# Patient Record
Sex: Female | Born: 1960 | Hispanic: No | Marital: Married | Smoking: Never smoker
Health system: Southern US, Community
[De-identification: ages and names within clinical notes are randomized; demographics above are authoritative.]

## PROBLEM LIST (undated history)

## (undated) DIAGNOSIS — E079 Disorder of thyroid, unspecified: Secondary | ICD-10-CM

## (undated) DIAGNOSIS — E119 Type 2 diabetes mellitus without complications: Secondary | ICD-10-CM

## (undated) DIAGNOSIS — I10 Essential (primary) hypertension: Secondary | ICD-10-CM

## (undated) HISTORY — PX: CHOLECYSTECTOMY: SHX55

## (undated) HISTORY — PX: OTHER SURGICAL HISTORY: SHX169

---

## 1998-01-16 ENCOUNTER — Other Ambulatory Visit: Admission: RE | Admit: 1998-01-16 | Discharge: 1998-01-16 | Payer: Self-pay | Admitting: Obstetrics and Gynecology

## 2000-12-23 ENCOUNTER — Other Ambulatory Visit: Admission: RE | Admit: 2000-12-23 | Discharge: 2000-12-23 | Payer: Self-pay | Admitting: Obstetrics and Gynecology

## 2002-02-09 ENCOUNTER — Other Ambulatory Visit: Admission: RE | Admit: 2002-02-09 | Discharge: 2002-02-09 | Payer: Self-pay | Admitting: Obstetrics and Gynecology

## 2016-09-12 ENCOUNTER — Observation Stay (HOSPITAL_COMMUNITY)
Admission: EM | Admit: 2016-09-12 | Discharge: 2016-09-13 | Disposition: A | Discharge status: Home / Self Care | Payer: Managed Care, Other (non HMO) | Attending: Internal Medicine | Admitting: Internal Medicine

## 2016-09-12 ENCOUNTER — Emergency Department (HOSPITAL_COMMUNITY): Payer: Managed Care, Other (non HMO)

## 2016-09-12 ENCOUNTER — Encounter (HOSPITAL_COMMUNITY): Payer: Self-pay | Admitting: Emergency Medicine

## 2016-09-12 DIAGNOSIS — Z881 Allergy status to other antibiotic agents status: Secondary | ICD-10-CM | POA: Insufficient documentation

## 2016-09-12 DIAGNOSIS — E039 Hypothyroidism, unspecified: Secondary | ICD-10-CM | POA: Insufficient documentation

## 2016-09-12 DIAGNOSIS — R0789 Other chest pain: Principal | ICD-10-CM | POA: Insufficient documentation

## 2016-09-12 DIAGNOSIS — R112 Nausea with vomiting, unspecified: Secondary | ICD-10-CM | POA: Insufficient documentation

## 2016-09-12 DIAGNOSIS — R011 Cardiac murmur, unspecified: Secondary | ICD-10-CM | POA: Diagnosis not present

## 2016-09-12 DIAGNOSIS — I10 Essential (primary) hypertension: Secondary | ICD-10-CM | POA: Diagnosis not present

## 2016-09-12 DIAGNOSIS — Z66 Do not resuscitate: Secondary | ICD-10-CM | POA: Insufficient documentation

## 2016-09-12 DIAGNOSIS — E119 Type 2 diabetes mellitus without complications: Secondary | ICD-10-CM | POA: Insufficient documentation

## 2016-09-12 DIAGNOSIS — Z8249 Family history of ischemic heart disease and other diseases of the circulatory system: Secondary | ICD-10-CM | POA: Insufficient documentation

## 2016-09-12 DIAGNOSIS — Z882 Allergy status to sulfonamides status: Secondary | ICD-10-CM | POA: Insufficient documentation

## 2016-09-12 DIAGNOSIS — R079 Chest pain, unspecified: Secondary | ICD-10-CM

## 2016-09-12 DIAGNOSIS — E118 Type 2 diabetes mellitus with unspecified complications: Secondary | ICD-10-CM

## 2016-09-12 DIAGNOSIS — E876 Hypokalemia: Secondary | ICD-10-CM | POA: Diagnosis not present

## 2016-09-12 HISTORY — DX: Type 2 diabetes mellitus without complications: E11.9

## 2016-09-12 HISTORY — DX: Essential (primary) hypertension: I10

## 2016-09-12 HISTORY — DX: Disorder of thyroid, unspecified: E07.9

## 2016-09-12 LAB — COMPREHENSIVE METABOLIC PANEL
ALT: 38 U/L (ref 14–54)
AST: 50 U/L — AB (ref 15–41)
Albumin: 3.8 g/dL (ref 3.5–5.0)
Alkaline Phosphatase: 59 U/L (ref 38–126)
Anion gap: 11 (ref 5–15)
BUN: 13 mg/dL (ref 6–20)
CHLORIDE: 103 mmol/L (ref 101–111)
CO2: 22 mmol/L (ref 22–32)
CREATININE: 0.83 mg/dL (ref 0.44–1.00)
Calcium: 9 mg/dL (ref 8.9–10.3)
GFR calc Af Amer: >60 mL/min (ref >60)
GLUCOSE: 136 mg/dL — AB (ref 65–99)
Potassium: 4.1 mmol/L (ref 3.5–5.1)
Sodium: 136 mmol/L (ref 135–145)
Total Bilirubin: 1.6 mg/dL — ABNORMAL HIGH (ref 0.3–1.2)
Total Protein: 6.4 g/dL — ABNORMAL LOW (ref 6.5–8.1)

## 2016-09-12 LAB — CBC
HEMATOCRIT: 43.2 % (ref 36.0–46.0)
Hemoglobin: 14.7 g/dL (ref 12.0–15.0)
MCH: 28 pg (ref 26.0–34.0)
MCHC: 34 g/dL (ref 30.0–36.0)
MCV: 82.3 fL (ref 78.0–100.0)
PLATELETS: 197 10*3/uL (ref 150–400)
RBC: 5.25 MIL/uL — AB (ref 3.87–5.11)
RDW: 13.6 % (ref 11.5–15.5)
WBC: 7.5 10*3/uL (ref 4.0–10.5)

## 2016-09-12 LAB — GLUCOSE, CAPILLARY
GLUCOSE-CAPILLARY: 123 mg/dL — AB (ref 65–99)
Glucose-Capillary: 163 mg/dL — ABNORMAL HIGH (ref 65–99)

## 2016-09-12 LAB — TROPONIN I

## 2016-09-12 MED ORDER — SODIUM CHLORIDE 0.9% FLUSH: 3.0000 mL | Freq: Two times a day (BID) | INTRAVENOUS | Status: DC

## 2016-09-12 MED ORDER — ACETAMINOPHEN 325 MG PO TABS: 650.0000 mg | ORAL_TABLET | ORAL | Status: DC | PRN

## 2016-09-12 MED ORDER — GI COCKTAIL ~~LOC~~: 30.0000 mL | Freq: Four times a day (QID) | ORAL | Status: DC | PRN

## 2016-09-12 MED ORDER — LEVOTHYROXINE SODIUM 100 MCG PO TABS
100.0000 ug | ORAL_TABLET | Freq: Every day | ORAL | Status: DC
  Administered 2016-09-13: 100 ug via ORAL
  Filled 2016-09-12: qty 1

## 2016-09-12 MED ORDER — SODIUM CHLORIDE 0.9% FLUSH
3.0000 mL | Freq: Two times a day (BID) | INTRAVENOUS | Status: DC
  Administered 2016-09-13: 3 mL via INTRAVENOUS

## 2016-09-12 MED ORDER — INSULIN ASPART 100 UNIT/ML ~~LOC~~ SOLN
0.0000 [IU] | Freq: Three times a day (TID) | SUBCUTANEOUS | Status: DC
  Administered 2016-09-13: 2 [IU] via SUBCUTANEOUS

## 2016-09-12 MED ORDER — SODIUM CHLORIDE 0.9% FLUSH: 3.0000 mL | INTRAVENOUS | Status: DC | PRN

## 2016-09-12 MED ORDER — ENOXAPARIN SODIUM 40 MG/0.4ML ~~LOC~~ SOLN
40.0000 mg | SUBCUTANEOUS | Status: DC
  Filled 2016-09-12: qty 0.4

## 2016-09-12 MED ORDER — HYDROCHLOROTHIAZIDE 25 MG PO TABS
25.0000 mg | ORAL_TABLET | Freq: Every day | ORAL | Status: DC
  Administered 2016-09-13: 25 mg via ORAL
  Filled 2016-09-12: qty 1

## 2016-09-12 MED ORDER — ACETAMINOPHEN 325 MG PO TABS: 650.0000 mg | ORAL_TABLET | Freq: Four times a day (QID) | ORAL | Status: DC | PRN

## 2016-09-12 MED ORDER — ASPIRIN 325 MG PO TABS: 325.0000 mg | ORAL_TABLET | Freq: Once | ORAL | Status: DC

## 2016-09-12 MED ORDER — NITROGLYCERIN 0.4 MG SL SUBL: 0.4000 mg | SUBLINGUAL_TABLET | SUBLINGUAL | Status: DC | PRN

## 2016-09-12 MED ORDER — INSULIN ASPART 100 UNIT/ML ~~LOC~~ SOLN: 0.0000 [IU] | Freq: Every day | SUBCUTANEOUS | Status: DC

## 2016-09-12 MED ORDER — POTASSIUM CHLORIDE CRYS ER 20 MEQ PO TBCR
20.0000 meq | EXTENDED_RELEASE_TABLET | Freq: Every day | ORAL | Status: DC
  Administered 2016-09-13: 20 meq via ORAL
  Filled 2016-09-12: qty 1

## 2016-09-12 MED ORDER — ALPRAZOLAM 0.25 MG PO TABS: 0.2500 mg | ORAL_TABLET | Freq: Two times a day (BID) | ORAL | Status: DC | PRN

## 2016-09-12 MED ORDER — HEPARIN SODIUM (PORCINE) 5000 UNIT/ML IJ SOLN: 5000.0000 [IU] | Freq: Three times a day (TID) | INTRAMUSCULAR | Status: DC

## 2016-09-12 MED ORDER — ONDANSETRON HCL 4 MG/2ML IJ SOLN: 4.0000 mg | Freq: Four times a day (QID) | INTRAMUSCULAR | Status: DC | PRN

## 2016-09-12 MED ORDER — SODIUM CHLORIDE 0.9 % IV SOLN: 250.0000 mL | INTRAVENOUS | Status: DC | PRN

## 2016-09-12 MED ORDER — CARVEDILOL 12.5 MG PO TABS
6.2500 mg | ORAL_TABLET | Freq: Two times a day (BID) | ORAL | Status: DC
  Administered 2016-09-12 – 2016-09-13 (×2): 6.25 mg via ORAL
  Filled 2016-09-12 (×2): qty 1

## 2016-09-12 MED ORDER — ASPIRIN 81 MG PO CHEW: 324.0000 mg | CHEWABLE_TABLET | Freq: Once | ORAL | Status: DC

## 2016-09-12 MED ORDER — ONDANSETRON HCL 4 MG PO TABS: 4.0000 mg | ORAL_TABLET | Freq: Four times a day (QID) | ORAL | Status: DC | PRN

## 2016-09-12 MED ORDER — OXYCODONE HCL 5 MG PO TABS: 5.0000 mg | ORAL_TABLET | ORAL | Status: DC | PRN

## 2016-09-12 MED ORDER — ENOXAPARIN SODIUM 40 MG/0.4ML ~~LOC~~ SOLN: 40.0000 mg | SUBCUTANEOUS | Status: DC

## 2016-09-12 MED ORDER — ACETAMINOPHEN 650 MG RE SUPP: 650.0000 mg | Freq: Four times a day (QID) | RECTAL | Status: DC | PRN

## 2016-09-12 NOTE — ED Triage Notes (Signed)
PT reports sudden onset central chest pain started at 1030 am. PT reports burning pain and pressure over central chest. PT felt as though pain would relieve if she belched. PT began to vomit. PT had 324 ASA in route, 4mg  IV zofran, 3 SL nitro tablets. PT reports chest pain has completely resolved. PT states, "I think it resolved after the last time I vomited." PT is unsure how many nitro she had beofre pain resolved.   PT reports no cardiac history or abdominal history.

## 2016-09-12 NOTE — H&P (Signed)
History and Physical    Candace ItoDeborah W Walls KGM:[id number] DOB: 1961/04/21 DOA: 09/12/2016  PCP: No primary care provider on file. Patient coming from: home  Chief Complaint: CP  HPI: Candace ItoDeborah W Walls is a 55 y.o. Walls with medical history significant of the chest abruptly at 10:30. Substernal. Pressure-like. Came on while cooking in the kitchen. Associated with flushed feeling and nausea. Tums without benefit. Nausea and phlegm-like emesis 2. Lasted 15-20 minutes. Relieved after receiving nitroglycerin. Approximately 4 weeks ago patient stopped atenolol and was started on carvedilol. No other recent medication changes. No previous cardiac workup.  Not associated with worsening from deep respirations or certain body movements. Rested after onset so no exertional assessment.  ED Course: Aspirin and nitroglycerin given. EKG outlined below.  Review of Systems: As per HPI otherwise 10 point review of systems negative.   Ambulatory Status: no restrictions  Past Medical History:  Diagnosis Date  . Diabetes mellitus without complication (HCC)   . Hypertension   . Thyroid disease     Past Surgical History:  Procedure Laterality Date  . DNC      Social History   Social History  . Marital status: Married    Spouse name: N/A  . Number of children: N/A  . Years of education: N/A   Occupational History  . Not on file.   Social History Main Topics  . Smoking status: Never Smoker  . Smokeless tobacco: Never Used  . Alcohol use No  . Drug use: No  . Sexual activity: Not on file   Other Topics Concern  . Not on file   Social History Narrative  . No narrative on file    Allergies  Allergen Reactions  . Sulfa Antibiotics Hives  . Minocycline Hives, Swelling and Rash    Family History  Problem Relation Age of Onset  . Heart disease Father   . Diabetes Father   . Renal Disease Father     ESRD  . Heart disease Sister   . Stroke Maternal Grandfather     Prior to  Admission medications   Medication Sig Start Date End Date Taking? Authorizing Provider  carvedilol (COREG) 6.25 MG tablet Take 6.25 mg by mouth 2 (two) times daily with a meal.   Yes Historical Provider, MD  dapagliflozin propanediol (FARXIGA) 10 MG TABS tablet Take 10 mg by mouth daily.   Yes Historical Provider, MD  hydrochlorothiazide (HYDRODIURIL) 25 MG tablet Take 25 mg by mouth daily.   Yes Historical Provider, MD  ibuprofen (ADVIL) 200 MG tablet Take 200 mg by mouth every 6 (six) hours as needed for mild pain.   Yes Historical Provider, MD  levothyroxine (SYNTHROID, LEVOTHROID) 100 MCG tablet Take 100 mcg by mouth daily before breakfast.   Yes Historical Provider, MD  potassium chloride SA (K-DUR,KLOR-CON) 20 MEQ tablet Take 20 mEq by mouth daily.   Yes Historical Provider, MD    Physical Exam: Vitals:   09/12/16 1415 09/12/16 1453 09/12/16 1454 09/12/16 1455  BP:  136/67    Pulse: 76 82 71 63  Resp: 18  26 19   Temp:      TempSrc:      SpO2: 98% 96% 97% 97%  Weight:      Height:         General:  Appears calm and comfortable Eyes:  PERRL, EOMI, normal lids, iris ENT:  grossly normal hearing, lips & tongue, mmm Neck:  no LAD, masses or thyromegaly Cardiovascular:  RRR, II/VI systolic murmur .  No LE edema.  Respiratory:  CTA bilaterally, no w/r/r. Normal respiratory effort. Abdomen:  soft, ntnd, NABS Skin:  no rash or induration seen on limited exam Musculoskeletal:  grossly normal tone BUE/BLE, good ROM, no bony abnormality Psychiatric:  grossly normal mood and affect, speech fluent and appropriate, AOx3 Neurologic:  CN 2-12 grossly intact, moves all extremities in coordinated fashion, sensation intact  Labs on Admission: I have personally reviewed following labs and imaging studies  CBC:  Recent Labs Lab 09/12/16 1300  WBC 7.5  HGB 14.7  HCT 43.2  MCV 82.3  PLT 197   Basic Metabolic Panel:  Recent Labs Lab 09/12/16 1300  NA 136  K 4.1  CL 103  CO2 22    GLUCOSE 136*  BUN 13  CREATININE 0.83  CALCIUM 9.0   GFR: Estimated Creatinine Clearance: 73.8 mL/min (by C-G formula based on SCr of 0.83 mg/dL). Liver Function Tests:  Recent Labs Lab 09/12/16 1300  AST 50*  ALT 38  ALKPHOS 59  BILITOT 1.6*  PROT 6.4*  ALBUMIN 3.8   No results for input(s): LIPASE, AMYLASE in the last 168 hours. No results for input(s): AMMONIA in the last 168 hours. Coagulation Profile: No results for input(s): INR, PROTIME in the last 168 hours. Cardiac Enzymes:  Recent Labs Lab 09/12/16 1300  TROPONINI <0.03   BNP (last 3 results) No results for input(s): PROBNP in the last 8760 hours. HbA1C: No results for input(s): HGBA1C in the last 72 hours. CBG: No results for input(s): GLUCAP in the last 168 hours. Lipid Profile: No results for input(s): CHOL, HDL, LDLCALC, TRIG, CHOLHDL, LDLDIRECT in the last 72 hours. Thyroid Function Tests: No results for input(s): TSH, T4TOTAL, FREET4, T3FREE, THYROIDAB in the last 72 hours. Anemia Panel: No results for input(s): VITAMINB12, FOLATE, FERRITIN, TIBC, IRON, RETICCTPCT in the last 72 hours. Urine analysis: No results found for: COLORURINE, APPEARANCEUR, LABSPEC, PHURINE, GLUCOSEU, HGBUR, BILIRUBINUR, KETONESUR, PROTEINUR, UROBILINOGEN, NITRITE, LEUKOCYTESUR  Creatinine Clearance: Estimated Creatinine Clearance: 73.8 mL/min (by C-G formula based on SCr of 0.83 mg/dL).  Sepsis Labs: @LABRCNTIP (procalcitonin:4,lacticidven:4) )No results found for this or any previous visit (from the past 240 hour(s)).   Radiological Exams on Admission: Dg Chest 2 View  Result Date: 09/12/2016 CLINICAL DATA:  Mid chest pain for 2-3 weeks. EXAM: CHEST  2 VIEW COMPARISON:  None. FINDINGS: The heart, hila, mediastinum, lungs, and pleura are normal. IMPRESSION: No active cardiopulmonary disease. Electronically Signed   By: Gerome Samavid  Williams III M.D   On: 09/12/2016 14:48    EKG: Independently reviewed. Sinus. Nonspecific  changes  Assessment/Plan Active Problems:   Chest pain   Essential hypertension   Hypothyroidism   Diabetes mellitus with complication (HCC)   CP: HEART score 5. No previous cardiac workup. Trop negative. EKG w/o over signs of ACS. Relieved w/ Nitro by EMS.  - Tele - cycle trop - EKG in am - GI cocktail - ASA, Nitro - NM stress test.   HTN: - Coreg, HCTZ  Hypothyroidism: - TSH  DM:  - SSI   DVT prophylaxis: lovenox  Code Status: full  Family Communication: none  Disposition Plan: pending cp r/o and NM stress test  Consults called: none  Admission status: obs - tele    Rosemarie Dondero J MD [facility]  If 7PM-7AM, please contact night-coverage [email or web address] Password [email or web address]  09/12/2016, 3:57 PM

## 2016-09-12 NOTE — ED Provider Notes (Signed)
[facility]-EMERGENCY DEPT Provider Note   CSN: [id number] Arrival date & time: 09/12/16  1228     History   Chief Complaint Chief Complaint  Patient presents with  . Chest Pain    HPI Karl ItoDeborah W Clary is a 55 y.o. female.   Chest Pain   This is a new problem. The current episode started 1 to 2 hours ago. The problem occurs constantly. The problem has been resolved. The pain is associated with exertion. The pain is present in the substernal region. The pain is moderate. The quality of the pain is described as pressure-like. The pain radiates to the left shoulder. Duration of episode(s) is 45 minutes. Associated symptoms include nausea, shortness of breath and vomiting.    Past Medical History:  Diagnosis Date  . Diabetes mellitus without complication (HCC)   . Hypertension   . Thyroid disease     There are no active problems to display for this patient.   Past Surgical History:  Procedure Laterality Date  . DNC      OB History    No data available       Home Medications    Prior to Admission medications   Medication Sig Start Date End Date Taking? Authorizing Provider  carvedilol (COREG) 6.25 MG tablet Take 6.25 mg by mouth 2 (two) times daily with a meal.   Yes Historical Provider, MD  dapagliflozin propanediol (FARXIGA) 10 MG TABS tablet Take 10 mg by mouth daily.   Yes Historical Provider, MD  hydrochlorothiazide (HYDRODIURIL) 25 MG tablet Take 25 mg by mouth daily.   Yes Historical Provider, MD  ibuprofen (ADVIL) 200 MG tablet Take 200 mg by mouth every 6 (six) hours as needed for mild pain.   Yes Historical Provider, MD  levothyroxine (SYNTHROID, LEVOTHROID) 100 MCG tablet Take 100 mcg by mouth daily before breakfast.   Yes Historical Provider, MD  potassium chloride SA (K-DUR,KLOR-CON) 20 MEQ tablet Take 20 mEq by mouth daily.   Yes Historical Provider, MD    Family History No family history on file.  Social History Social History  Substance Use Topics    . Smoking status: Never Smoker  . Smokeless tobacco: Never Used  . Alcohol use No     Allergies   Sulfa antibiotics and Minocycline   Review of Systems Review of Systems  Respiratory: Positive for shortness of breath.   Cardiovascular: Positive for chest pain.  Gastrointestinal: Positive for nausea and vomiting.  All other systems reviewed and are negative.    Physical Exam Updated Vital Signs BP 140/55   Pulse 63   Temp 98.1 F (36.7 C) (Oral)   Resp 20   Ht 5' (1.524 m)   Wt 178 lb (80.7 kg)   SpO2 100%   BMI 34.76 kg/m   Physical Exam  Constitutional: She is oriented to person, place, and time. She appears well-developed and well-nourished.  HENT:  Head: Normocephalic and atraumatic.  Eyes: Conjunctivae and EOM are normal.  Neck: Normal range of motion.  Cardiovascular: Normal rate and regular rhythm.  Exam reveals no gallop and no friction rub.   No murmur heard. Pulmonary/Chest: Effort normal. No stridor. No respiratory distress.  Abdominal: Soft. She exhibits no distension.  Musculoskeletal: She exhibits no edema or deformity.  Neurological: She is alert and oriented to person, place, and time.  Skin: Skin is warm and dry.  Nursing note and vitals reviewed.    ED Treatments / Results  Labs (all labs ordered are listed, but  only abnormal results are displayed) Labs Reviewed  CBC  COMPREHENSIVE METABOLIC PANEL  TROPONIN I  I-STAT TROPOININ, ED    EKG  EKG Interpretation  Date/Time:  Thursday September 12 2016 12:33:05 EST Ventricular Rate:  66 PR Interval:    QRS Duration: 100 QT Interval:  427 QTC Calculation: 448 R Axis:   20 Text Interpretation:  Sinus rhythm Probable left atrial enlargement RSR' in V1 or V2, probably normal variant Borderline repolarization abnormality nonspecific ST changes in inferor and atnerior leads No old tracing to compare Confirmed by [facility] MD, Barbara CowerJASON [phone]) on 09/12/2016 12:44:54 PM       Radiology No  results found.  Procedures Procedures (including critical care time)  Medications Ordered in ED Medications  aspirin chewable tablet 324 mg (not administered)  nitroGLYCERIN (NITROSTAT) SL tablet 0.4 mg (not administered)     Initial Impression / Assessment and Plan / ED Course  I have reviewed the triage vital signs and the nursing notes.  Pertinent labs & imaging results that were available during my care of the patient were reviewed by me and considered in my medical decision making (see chart for details).  Clinical Course     Atypical chest pain but some family history of ht dz at early age and some risk factors so will admit to hospital for management and further workup   Final Clinical Impressions(s) / ED Diagnoses   Final diagnoses:  None    New Prescriptions New Prescriptions   No medications on file     Marily MemosJason Karlee Staff, MD 09/14/16 [phone]

## 2016-09-13 ENCOUNTER — Encounter (HOSPITAL_COMMUNITY): Payer: Self-pay | Admitting: Student

## 2016-09-13 ENCOUNTER — Other Ambulatory Visit: Payer: Self-pay | Admitting: Student

## 2016-09-13 ENCOUNTER — Observation Stay (HOSPITAL_BASED_OUTPATIENT_CLINIC_OR_DEPARTMENT_OTHER): Payer: Managed Care, Other (non HMO)

## 2016-09-13 DIAGNOSIS — E118 Type 2 diabetes mellitus with unspecified complications: Secondary | ICD-10-CM | POA: Diagnosis not present

## 2016-09-13 DIAGNOSIS — R072 Precordial pain: Secondary | ICD-10-CM | POA: Diagnosis not present

## 2016-09-13 DIAGNOSIS — I200 Unstable angina: Secondary | ICD-10-CM

## 2016-09-13 DIAGNOSIS — I10 Essential (primary) hypertension: Secondary | ICD-10-CM | POA: Diagnosis not present

## 2016-09-13 DIAGNOSIS — R079 Chest pain, unspecified: Secondary | ICD-10-CM

## 2016-09-13 DIAGNOSIS — E038 Other specified hypothyroidism: Secondary | ICD-10-CM | POA: Diagnosis not present

## 2016-09-13 DIAGNOSIS — I38 Endocarditis, valve unspecified: Secondary | ICD-10-CM

## 2016-09-13 DIAGNOSIS — R011 Cardiac murmur, unspecified: Secondary | ICD-10-CM

## 2016-09-13 LAB — NM MYOCAR MULTI W/SPECT W/WALL MOTION / EF
CHL CUP NUCLEAR SDS: 3
CHL CUP NUCLEAR SRS: 5
CHL CUP NUCLEAR SSS: 8
Exercise duration (min): 4 min
LV dias vol: 65 mL (ref 46–106)
LVSYSVOL: 15 mL
NUC STRESS TID: 1.13
RATE: 0.13
Rest HR: 62 {beats}/min

## 2016-09-13 LAB — GLUCOSE, CAPILLARY
Glucose-Capillary: 155 mg/dL — ABNORMAL HIGH (ref 65–99)
Glucose-Capillary: 123 mg/dL — ABNORMAL HIGH (ref 65–99)
Glucose-Capillary: 140 mg/dL — ABNORMAL HIGH (ref 65–99)

## 2016-09-13 LAB — BASIC METABOLIC PANEL
ANION GAP: 10 (ref 5–15)
BUN: 12 mg/dL (ref 6–20)
CALCIUM: 8.8 mg/dL — AB (ref 8.9–10.3)
CO2: 25 mmol/L (ref 22–32)
CREATININE: 0.76 mg/dL (ref 0.44–1.00)
Chloride: 102 mmol/L (ref 101–111)
Glucose, Bld: 159 mg/dL — ABNORMAL HIGH (ref 65–99)
Potassium: 2.9 mmol/L — ABNORMAL LOW (ref 3.5–5.1)
SODIUM: 137 mmol/L (ref 135–145)

## 2016-09-13 LAB — CBC
HCT: 40.7 % (ref 36.0–46.0)
Hemoglobin: 13.8 g/dL (ref 12.0–15.0)
MCH: 27.9 pg (ref 26.0–34.0)
MCHC: 33.9 g/dL (ref 30.0–36.0)
MCV: 82.2 fL (ref 78.0–100.0)
PLATELETS: 235 10*3/uL (ref 150–400)
RBC: 4.95 MIL/uL (ref 3.87–5.11)
RDW: 13.5 % (ref 11.5–15.5)
WBC: 6.4 10*3/uL (ref 4.0–10.5)

## 2016-09-13 MED ORDER — TECHNETIUM TC 99M TETROFOSMIN IV KIT
10.0000 | PACK | Freq: Once | INTRAVENOUS | Status: AC | PRN
  Administered 2016-09-13: 10 via INTRAVENOUS

## 2016-09-13 MED ORDER — REGADENOSON 0.4 MG/5ML IV SOLN
INTRAVENOUS | Status: AC
  Filled 2016-09-13: qty 5

## 2016-09-13 MED ORDER — POTASSIUM CHLORIDE CRYS ER 20 MEQ PO TBCR
40.0000 meq | EXTENDED_RELEASE_TABLET | Freq: Once | ORAL | Status: AC
  Administered 2016-09-13: 40 meq via ORAL
  Filled 2016-09-13: qty 2

## 2016-09-13 MED ORDER — OMEPRAZOLE 40 MG PO CPDR: 40.0000 mg | DELAYED_RELEASE_CAPSULE | Freq: Every day | ORAL | 0 refills | Status: AC

## 2016-09-13 MED ORDER — REGADENOSON 0.4 MG/5ML IV SOLN
0.4000 mg | Freq: Once | INTRAVENOUS | Status: AC
  Administered 2016-09-13: 0.4 mg via INTRAVENOUS

## 2016-09-13 MED ORDER — TECHNETIUM TC 99M TETROFOSMIN IV KIT
30.0000 | PACK | Freq: Once | INTRAVENOUS | Status: AC | PRN
  Administered 2016-09-13: 30 via INTRAVENOUS

## 2016-09-13 NOTE — Consult Note (Signed)
Cardiology Consult    Patient ID: Candace ItoDeborah W Gindlesperger MRN: [id number], DOB/AGE: 53/16/1962   Admit date: 09/12/2016 Date of Consult: 09/13/2016  Primary Physician: No primary care provider on file. Reason for Consult: Chest Pain Primary Cardiologist: New - Dr. Antoine PocheHochrein Requesting Provider: Dr. Arbutus Leasat   History of Present Illness    Candace Walls is a 55 y.o. female with past medical history of HTN, Hypothyroidism, Type 2 DM, and no prior cardiac history who [facility] for evaluation of chest pain.   She reports developing chest discomfort yesterday morning while cooking for Thanksgiving. Consumed a piece of [place] then developed a sternal chest pressure minutes after. Denies any associated dyspnea, diaphoresis, or radiating pain. She took TUMS without relief. She then became anxious about her pain and told her husband to call EMS. She was given SL NTG while [facility] route [facility] which did not fully relieve her symptoms. This did cause her to experience nausea and vomiting. Her pain spontaneously resolved while in the ED and has not represented since.   She denies any recent chest discomfort or dyspnea with exertion. [place] and had mild dyspnea while walking on the trail, but she reports not exercising regularly and says she is not use to walking at that pace or incline. Has been able to perform activities at home and at work with no anginal symptoms.  She does have a family history of CAD, with her sister requiring "open-heart surgery" at age 53. Unsure if this was CABG or a different procedure. Her father passed away from an MI in his 1960's. She denies any prior tobacco use, alcohol use, or recreational drug use.   While admitted, labs show a WBC of 7.5, Hgb 14.7, and platelets 197. K+ 4.1. Creatinine 0.83. Cyclic troponin values have been negative. CXR with no acite cardiopulmonary disease. EKG showed NSR, HR 66  with TWI in leads III and V1. Repeat EKG this AM shows sinus bradycardia, HR 56, with mild LVH and TWI in leads III, V1, and V2. No prior tracings are available for comparison.     Past Medical History   Past Medical History:  Diagnosis Date  . Diabetes mellitus without complication (HCC)   . Hypertension   . Thyroid disease     Past Surgical History:  Procedure Laterality Date  . DNC       Allergies  Allergies  Allergen Reactions  . Sulfa Antibiotics Hives  . Minocycline Hives, Swelling and Rash    Inpatient Medications    . aspirin  324 mg Oral Once  . carvedilol  6.25 mg Oral BID WC  . enoxaparin (LOVENOX) injection  40 mg Subcutaneous Q24H  . hydrochlorothiazide  25 mg Oral Daily  . insulin aspart  0-5 Units Subcutaneous QHS  . insulin aspart  0-9 Units Subcutaneous TID WC  . levothyroxine  100 mcg Oral QAC breakfast  . potassium chloride SA  20 mEq Oral Daily  . sodium chloride flush  3 mL Intravenous Q12H  . sodium chloride flush  3 mL Intravenous Q12H    Family History    Family History  Problem Relation Age of Onset  . Heart disease Father     Fatal MI in his 4260's.   . Diabetes Father   . Renal Disease Father     ESRD  . Heart disease Sister   . Stroke Maternal Grandfather  Social History    Social History   Social History  . Marital status: Married    Spouse name: N/A  . Number of children: N/A  . Years of education: N/A   Occupational History  . Not on file.   Social History Main Topics  . Smoking status: Never Smoker  . Smokeless tobacco: Never Used  . Alcohol use No  . Drug use: No  . Sexual activity: Not on file   Other Topics Concern  . Not on file   Social History Narrative  . No narrative on file     Review of Systems    General:  No chills, fever, night sweats or weight changes.  Cardiovascular:  No dyspnea on exertion, edema, orthopnea, palpitations, paroxysmal nocturnal dyspnea. Positive for chest pain.    Dermatological: No rash, lesions/masses Respiratory: No cough, dyspnea Urologic: No hematuria, dysuria Abdominal:   No diarrhea, bright red blood per rectum, melena, or hematemesis. Positive for nausea and vomiting.  Neurologic:  No visual changes, wkns, changes in mental status. All other systems reviewed and are otherwise negative except as noted above.  Physical Exam    Blood pressure 140/70, pulse 68, temperature 98.4 F (36.9 C), temperature source Oral, resp. rate 18, height 5' (1.524 m), weight 174 lb 6.4 oz (79.1 kg), SpO2 100 %.  General: Pleasant, Caucasian female appearing in NAD. Psych: Normal affect. Neuro: Alert and oriented X 3. Moves all extremities spontaneously. HEENT: Normal  Neck: Supple without bruits or JVD. Lungs:  Resp regular and unlabored, CTA without wheezing or rales. Heart: RRR no s3, s4, or murmurs. Abdomen: Soft, non-tender, non-distended, BS + x 4.  Extremities: No clubbing, cyanosis or edema. DP/PT/Radials 2+ and equal bilaterally.  Labs    Troponin (Point of Care Test) No results for input(s): TROPIPOC in the last 72 hours.  Recent Labs  09/12/16 1300 09/12/16 1647 09/12/16 1815 09/12/16 2200  TROPONINI <0.03 <0.03 <0.03 <0.03   Lab Results  Component Value Date   WBC 6.4 09/13/2016   HGB 13.8 09/13/2016   HCT 40.7 09/13/2016   MCV 82.2 09/13/2016   PLT 235 09/13/2016     Recent Labs Lab 09/12/16 1300 09/13/16 0406  NA 136 137  K 4.1 2.9*  CL 103 102  CO2 22 25  BUN 13 12  CREATININE 0.83 0.76  CALCIUM 9.0 8.8*  PROT 6.4*  --   BILITOT 1.6*  --   ALKPHOS 59  --   ALT 38  --   AST 50*  --   GLUCOSE 136* 159*   No results found for: CHOL, HDL, LDLCALC, TRIG No results found for: [facility]   Radiology Studies    Dg Chest 2 View  Result Date: 09/12/2016 CLINICAL DATA:  Mid chest pain for 2-3 weeks. EXAM: CHEST  2 VIEW COMPARISON:  None. FINDINGS: The heart, hila, mediastinum, lungs, and pleura are normal. IMPRESSION: No  active cardiopulmonary disease. Electronically Signed   By: Gerome Sam III M.D   On: 09/12/2016 14:48    EKG & Cardiac Imaging    EKG: Sinus bradycardia, HR 56, with LVH and TWI in leads III, V1, and V2. (No previous tracings prior to this admission available for comparison).   Echocardiogram: None on File  Assessment & Plan    1. Atypical Chest Pain  - the patient developed chest discomfort while consuming [place] yesterday morning. No association with exertion. Pain was not relieved with TUMS or NTG, but resolved spontaneously while in the  ED. Denies any recurrence of chest discomfort since being admitted. - no known prior cardiac history. Does have cardiac risk factors including HTN, Type 2 DM, and family history of CAD (sister required "open-heart surgery" at age 53. Unsure if this was CABG or a different procedure. Father passed away from an MI in his 4660's).   - Cyclic troponin values have been negative. EKG shows NSR, HR 66 with TWI in leads III and V1 (no prior tracings are available for comparison).   - with her atypical symptoms and negative cardiac enzymes, will plan for a Nuclear Stress Test to further evaluate for ischemia. If stress test is low-risk, would recommend initiation of PPI for likely GERD.   2. HTN - variable at 118/55 - 165/82. - continue Coreg 6.25mg  BID and HCTZ 25mg  daily.   3. Type 2 DM - on Farxiga prior to admission. SSI while admitted.   4. Hypokalemia - K+ 2.9 this AM - being replaced.   Signed, Ellsworth LennoxBrittany M Strader, PA-C 09/13/2016, 10:06 AM Pager: [phone]  History and all data above reviewed.  Patient examined.  I agree with the findings as above.  The patient has chest pain as described above.  Atypical greater than typical features.  No objective evidence of ischemia.  The patient exam reveals COR:RRR, no rub, systolic murmur very brief and soft at the apex and mild diastolic murmur.    ,  Lungs: Clear  ,  Abd: Positive bowel sounds, no  rebound no guarding, Ext No edema  .  All available labs, radiology testing, previous records reviewed. Agree with documented assessment and plan. Chest pain:  Atypical.  Await results of Lexiscan Myoview.  Murmur:  Out patient echo.   Fayrene FearingJames Lamon Rotundo  2:25 PM  09/13/2016

## 2016-09-13 NOTE — Progress Notes (Signed)
Pt discharge education provided to patient and husband at bedside. IV discontinued, catheter intact, and telemetry box removed. All belongings in hand and discharge paperwork. Pt escorted off unit via wheelchair with nurse tech. Nickalus Thornsberry 09/13/2016 1514

## 2016-09-13 NOTE — Discharge Summary (Signed)
Physician Discharge Summary  Candace Walls [id number] DOB: Aug 31, 1961 DOA: 09/12/2016  PCP: No primary care provider on file.  Admit date: 09/12/2016 Discharge date: 09/13/2016  Admitted From: home Disposition:  home  Recommendations for Outpatient Follow-up:  1. Follow up with PCP in 1-2 weeks 2. Follow with cardiology in office in 1-2 weeks, Dr. Antoine PocheHochrein  Home Health: NO Equipment/Devices:None  Discharge Condition: Stable CODE STATUS: FULL Diet recommendation: Heart Healthy / Carb Modified   Brief/Interim Summary: 55 year old female with a history of diabetes, hypertension, and hypothyroidism presented with acute onset of substernal chest discomfort that began on the morning of 09/12/2016.  The patient states that she has had intermittent chest discomfort for the past 2 months, but states that it usually has occurred after eating and would improve with belching. However, her episode of chest discomfort on November 23 was associated with nausea and emesis and lasted longer than usual. EMS was activated. Apparently patient had a blood pressure 198/102 that was obtained by EMS. The patient states that she was recently changed from atenolol 25 mg daily to carvedilol 6.25 mg twice a day for better blood pressure control. The patient denied any dizziness, syncope, shortness breath.  She states that she had a sister who had coronary disease resulting in CABG in her early 4040s.  Discharge Diagnoses:  Atypical chest pain -quite atypical by clinical hx -The patient has risk factors for coronary disease condition to premature coronary disease in her sister -consulted cardiology-->myoviewed ordered -09/13/16 myoview--low risk, EF 76% -Troponins negative 4 -EKG--sinus rhythm, nonspecific ST changes -Chest x-ray negative -trial of omeprazole 40 mg daily after d/c  Hypertension -Continue carvedilol and HCTZ  Hypokalemia -Repleted  Diabetes mellitus type 2/NDIDM -takes  [place]xiga as outpt--resume after d/c  Hypothyroidism -Continue levothyroxine   Discharge Instructions  Discharge Instructions    Diet - low sodium heart healthy    Complete by:  As directed    Increase activity slowly    Complete by:  As directed        Medication List    TAKE these medications   ADVIL 200 MG tablet Generic drug:  ibuprofen Take 200 mg by mouth every 6 (six) hours as needed for mild pain.   carvedilol 6.25 MG tablet Commonly known as:  COREG Take 6.25 mg by mouth 2 (two) times daily with a meal.   FARXIGA 10 MG Tabs tablet Generic drug:  dapagliflozin propanediol Take 10 mg by mouth daily.   hydrochlorothiazide 25 MG tablet Commonly known as:  HYDRODIURIL Take 25 mg by mouth daily.   levothyroxine 100 MCG tablet Commonly known as:  SYNTHROID, LEVOTHROID Take 100 mcg by mouth daily before breakfast.   omeprazole 40 MG capsule Commonly known as:  PRILOSEC Take 1 capsule (40 mg total) by mouth daily.   potassium chloride SA 20 MEQ tablet Commonly known as:  K-DUR,KLOR-CON Take 20 mEq by mouth daily.      Follow-up Information    Rollene RotundaJames Hochrein, MD Follow up.   Specialty:  Cardiology Why:  The office will contact you to arrange your echocardiogram (ultrasound of your heart). If you do not hear from them in 3 business days, please contact the number provided.  Contact information: [address] [place] [id number] [phone]          Allergies  Allergen Reactions  . Sulfa Antibiotics Hives  . Minocycline Hives, Swelling and Rash    Consultations:  Cardiology   Procedures/Studies: Dg Chest 2 View  Result Date: 09/12/2016 CLINICAL DATA:  Mid chest pain for 2-3 weeks. EXAM: CHEST  2 VIEW COMPARISON:  None. FINDINGS: The heart, hila, mediastinum, lungs, and pleura are normal. IMPRESSION: No active cardiopulmonary disease. Electronically Signed   By: Gerome Sam III M.D   On: 09/12/2016 14:48   Nm Myocar Multi  W/spect Candace Walls Motion / Ef  Result Date: 09/13/2016  Downsloping ST segment depression ST segment depression of 0.8 mm was noted during stress in the V4, V5 and V6 leads, beginning at 4 minutes of stress.  T wave inversion was noted during stress in the V4, V5 and V6 leads, beginning at 4 minutes of stress.  This is a low risk study.  Nuclear stress EF: 76%.  Normal perfusion. LVEF 76% with normal wall motion. This is a low risk study.        Discharge Exam: Vitals:   09/13/16 1141 09/13/16 1316  BP: (!) 162/93 (!) 141/71  Pulse: 99 66  Resp:  18  Temp:  97.7 F (36.5 C)   Vitals:   09/13/16 1138 09/13/16 1140 09/13/16 1141 09/13/16 1316  BP: (!) 162/95 (!) 148/94 (!) 162/93 (!) 141/71  Pulse: (!) 120 (!) 112 99 66  Resp:    18  Temp:    97.7 F (36.5 C)  TempSrc:    Oral  SpO2:    92%  Weight:      Height:        General: Pt is alert, awake, not in acute distress Cardiovascular: RRR, S1/S2 +, no rubs, no gallops Respiratory: CTA bilaterally, no wheezing, no rhonchi Abdominal: Soft, NT, ND, bowel sounds + Extremities: no edema, no cyanosis   The results of significant diagnostics from this hospitalization (including imaging, microbiology, ancillary and laboratory) are listed below for reference.    Significant Diagnostic Studies: Dg Chest 2 View  Result Date: 09/12/2016 CLINICAL DATA:  Mid chest pain for 2-3 weeks. EXAM: CHEST  2 VIEW COMPARISON:  None. FINDINGS: The heart, hila, mediastinum, lungs, and pleura are normal. IMPRESSION: No active cardiopulmonary disease. Electronically Signed   By: Gerome Sam III M.D   On: 09/12/2016 14:48   Nm Myocar Multi W/spect Candace Walls Motion / Ef  Result Date: 09/13/2016  Downsloping ST segment depression ST segment depression of 0.8 mm was noted during stress in the V4, V5 and V6 leads, beginning at 4 minutes of stress.  T wave inversion was noted during stress in the V4, V5 and V6 leads, beginning at 4 minutes of  stress.  This is a low risk study.  Nuclear stress EF: 76%.  Normal perfusion. LVEF 76% with normal wall motion. This is a low risk study.     Microbiology: No results found for this or any previous visit (from the past 240 hour(s)).   Labs: Basic Metabolic Panel:  Recent Labs Lab 09/12/16 1300 09/13/16 0406  NA 136 137  K 4.1 2.9*  CL 103 102  CO2 22 25  GLUCOSE 136* 159*  BUN 13 12  CREATININE 0.83 0.76  CALCIUM 9.0 8.8*   Liver Function Tests:  Recent Labs Lab 09/12/16 1300  AST 50*  ALT 38  ALKPHOS 59  BILITOT 1.6*  PROT 6.4*  ALBUMIN 3.8   No results for input(s): LIPASE, AMYLASE in the last 168 hours. No results for input(s): AMMONIA in the last 168 hours. CBC:  Recent Labs Lab 09/12/16 1300 09/13/16 0406  WBC 7.5 6.4  HGB 14.7 13.8  HCT 43.2 40.7  MCV  82.3 82.2  PLT 197 235   Cardiac Enzymes:  Recent Labs Lab 09/12/16 1300 09/12/16 1647 09/12/16 1815 09/12/16 2200  TROPONINI <0.03 <0.03 <0.03 <0.03   BNP: Invalid input(s): POCBNP CBG:  Recent Labs Lab 09/12/16 1642 09/12/16 2102 09/13/16 0618 09/13/16 0810 09/13/16 1352  GLUCAP 123* 163* 155* 123* 140*    Time coordinating discharge:  Greater than 30 minutes  Signed:  Kingston Shawgo, DO [facility] Pager: [phone] 09/13/2016, 3:08 PM

## 2016-09-13 NOTE — Progress Notes (Signed)
PROGRESS NOTE  Candace ItoDeborah W Walls [id number] DOB: May 02, 1961 DOA: 09/12/2016 PCP: No primary care provider on file.  Brief History:  55 year old female with a history of diabetes, hypertension, and hypothyroidism presented with acute onset of substernal chest discomfort that began on the morning of 09/12/2016.  The patient states that she has had intermittent chest discomfort for the past 2 months, but states that it usually has occurred after eating and would improve with belching. However, her episode of chest discomfort on November 23 was associated with nausea and emesis and lasted longer than usual. EMS was activated. Apparently patient had a blood pressure 198/102 that was obtained by EMS. The patient states that she was recently changed from atenolol 25 mg daily to carvedilol 6.25 mg twice a day for better blood pressure control. The patient denied any dizziness, syncope, shortness breath.  She states that she had a sister who had coronary disease resulting in CABG in her early 6140s.  Assessment/Plan: Atypical chest pain -quite atypical by clinical hx -The patient has risk factors for coronary disease condition to premature coronary disease in her sister -consulted cardiology -Troponins negative 4 -EKG--sinus rhythm, nonspecific ST changes -Chest x-ray negative  Hypertension -Continue carvedilol and HCTZ  Hypokalemia -Replete  Diabetes mellitus type 2/NDIDM -takes [place]Farxiga as outpt  Hypothyroidism -Continue levothyroxine    Disposition Plan:   Home when cleared by cardiology Family Communication:   Family at bedside--Total time spent 35 minutes.  Greater than 50% spent face to face counseling and coordinating care.   Consultants:  Cardiology  Code Status:  FULL / DNR  DVT Prophylaxis:  [place] Heparin / [place] Lovenox   Procedures: As Listed in Progress Note Above  Antibiotics: None    Subjective: Patient denies fevers, chills, headache, chest pain,  dyspnea, nausea, vomiting, diarrhea, abdominal pain, dysuria, hematuria, hematochezia, and melena.   Objective: Vitals:   09/12/16 1637 09/12/16 2013 09/12/16 2356 09/13/16 0606  BP: (!) 165/71 130/76 130/71 (!) 142/70  Pulse: (!) 58 68 72 60  Resp: 18 20 16 16   Temp: 98.6 F (37 C) 98.4 F (36.9 C) 98.6 F (37 C) 98.4 F (36.9 C)  TempSrc: Oral Oral Oral Oral  SpO2: 98% 96% 99% 98%  Weight: 79.5 kg (175 lb 4.8 oz)   79.1 kg (174 lb 6.4 oz)  Height: 5' (1.524 m)       Intake/Output Summary (Last 24 hours) at 09/13/16 0803 Last data filed at 09/13/16 0616  Gross per 24 hour  Intake              360 ml  Output             1350 ml  Net             -990 ml   Weight change:  Exam:   General:  Pt is alert, follows commands appropriately, not in acute distress  HEENT: No icterus, No thrush, No neck mass, [place]/AT  Cardiovascular: RRR, S1/S2, no rubs, no gallops  Respiratory: CTA bilaterally, no wheezing, no crackles, no rhonchi  Abdomen: Soft/+BS, non tender, non distended, no guarding  Extremities: No edema, No lymphangitis, No petechiae, No rashes, no synovitis   Data Reviewed: I have personally reviewed following labs and imaging studies Basic Metabolic Panel:  Recent Labs Lab 09/12/16 1300 09/13/16 0406  NA 136 137  K 4.1 2.9*  CL 103 102  CO2 22 25  GLUCOSE 136* 159*  BUN  13 12  CREATININE 0.83 0.76  CALCIUM 9.0 8.8*   Liver Function Tests:  Recent Labs Lab 09/12/16 1300  AST 50*  ALT 38  ALKPHOS 59  BILITOT 1.6*  PROT 6.4*  ALBUMIN 3.8   No results for input(s): LIPASE, AMYLASE in the last 168 hours. No results for input(s): AMMONIA in the last 168 hours. Coagulation Profile: No results for input(s): INR, PROTIME in the last 168 hours. CBC:  Recent Labs Lab 09/12/16 1300 09/13/16 0406  WBC 7.5 6.4  HGB 14.7 13.8  HCT 43.2 40.7  MCV 82.3 82.2  PLT 197 235   Cardiac Enzymes:  Recent Labs Lab 09/12/16 1300 09/12/16 1647  09/12/16 1815 09/12/16 2200  TROPONINI <0.03 <0.03 <0.03 <0.03   BNP: Invalid input(s): POCBNP CBG:  Recent Labs Lab 09/12/16 1642 09/12/16 2102 09/13/16 0618  GLUCAP 123* 163* 155*   HbA1C: No results for input(s): HGBA1C in the last 72 hours. Urine analysis: No results found for: COLORURINE, APPEARANCEUR, LABSPEC, PHURINE, GLUCOSEU, HGBUR, BILIRUBINUR, KETONESUR, PROTEINUR, UROBILINOGEN, NITRITE, LEUKOCYTESUR Sepsis Labs: @LABRCNTIP (procalcitonin:4,lacticidven:4) )No results found for this or any previous visit (from the past 240 hour(s)).   Scheduled Meds: . aspirin  324 mg Oral Once  . carvedilol  6.25 mg Oral BID WC  . enoxaparin (LOVENOX) injection  40 mg Subcutaneous Q24H  . hydrochlorothiazide  25 mg Oral Daily  . insulin aspart  0-5 Units Subcutaneous QHS  . insulin aspart  0-9 Units Subcutaneous TID WC  . levothyroxine  100 mcg Oral QAC breakfast  . potassium chloride SA  20 mEq Oral Daily  . potassium chloride  40 mEq Oral Once  . sodium chloride flush  3 mL Intravenous Q12H  . sodium chloride flush  3 mL Intravenous Q12H   Continuous Infusions:  Procedures/Studies: Dg Chest 2 View  Result Date: 09/12/2016 CLINICAL DATA:  Mid chest pain for 2-3 weeks. EXAM: CHEST  2 VIEW COMPARISON:  None. FINDINGS: The heart, hila, mediastinum, lungs, and pleura are normal. IMPRESSION: No active cardiopulmonary disease. Electronically Signed   By: Gerome Samavid  Williams III M.D   On: 09/12/2016 14:48    Alene Bergerson, DO  [facility] Pager [phone]  If 7PM-7AM, please contact night-coverage [email or web address] Password [email or web address] 09/13/2016, 8:03 AM   LOS: 0 days

## 2016-09-18 ENCOUNTER — Telehealth: Payer: Self-pay | Admitting: Cardiology

## 2016-09-18 NOTE — Telephone Encounter (Signed)
Per note:  Downsloping ST segment depression ST segment depression of 0.8 mm was noted during stress in the V4, V5 and V6 leads, beginning at 4 minutes of stress.  Pt states that her PCP Dr Feliciana RossettiGreg Grisso looked at the reading of the stress test and is questioning the 0.848mm depression in the reading she states that he thinks that this is typo and would like Dr Antoine PocheHochrein to review and get back to PCP and to patient.  Please advise

## 2016-09-18 NOTE — Telephone Encounter (Signed)
Pt was seen in the hospital by Dr [place] LionsHochrien  And James IvanoffBrittany StraderWhile she was in the hospital she had a stress test.Today she went to see her primary doctor Dr Vernie MurdersGreg Grisso,he had some questions and concerns about her stress test.Pt would like for somebody to look at stress test today and review it and give her the results please.

## 2016-09-20 NOTE — Telephone Encounter (Signed)
Follow up    Pt verbalized that she wants rn to call her about stress test

## 2016-09-23 ENCOUNTER — Telehealth: Payer: Self-pay | Admitting: Cardiology

## 2016-09-23 NOTE — Telephone Encounter (Signed)
Printed copy of stress test given to Dr Jens Somrenshaw, to reviewed and to call and speak to Dr Devonne DoughtyGriso in [place]

## 2016-09-23 NOTE — Telephone Encounter (Signed)
Candace Walls is calling concerning her stress test . Her PCP(Dr. Ivar BuryGregg Griso at [facility] in [place] [phone]) had a question about it and is asking if Dr.Hochrein could review and get back with him and [name] . [name] states that she has not heard from anyone as of yet . Please call   Thanks

## 2016-09-24 NOTE — Telephone Encounter (Signed)
Was told by Deliah Goodyebra Mathis, RN that Dr Jens Somrenshaw did call and speak with Dr Devonne DoughtyGriso about pt stress test.

## 2016-09-24 NOTE — Telephone Encounter (Signed)
Was told by Debra Mathis, RN that Dr Crenshaw did call and speak with Dr Griso about pt stress test. 

## 2016-10-11 ENCOUNTER — Ambulatory Visit (HOSPITAL_COMMUNITY): Payer: Managed Care, Other (non HMO) | Attending: Cardiology

## 2016-10-11 ENCOUNTER — Other Ambulatory Visit: Payer: Self-pay

## 2016-10-11 DIAGNOSIS — I071 Rheumatic tricuspid insufficiency: Secondary | ICD-10-CM | POA: Diagnosis not present

## 2016-10-11 DIAGNOSIS — E119 Type 2 diabetes mellitus without complications: Secondary | ICD-10-CM | POA: Insufficient documentation

## 2016-10-11 DIAGNOSIS — I38 Endocarditis, valve unspecified: Secondary | ICD-10-CM | POA: Insufficient documentation

## 2016-10-11 DIAGNOSIS — R079 Chest pain, unspecified: Secondary | ICD-10-CM | POA: Diagnosis not present

## 2016-10-11 DIAGNOSIS — Z8249 Family history of ischemic heart disease and other diseases of the circulatory system: Secondary | ICD-10-CM | POA: Insufficient documentation

## 2016-10-11 DIAGNOSIS — I351 Nonrheumatic aortic (valve) insufficiency: Secondary | ICD-10-CM | POA: Diagnosis not present

## 2016-10-11 DIAGNOSIS — I10 Essential (primary) hypertension: Secondary | ICD-10-CM | POA: Insufficient documentation

## 2016-10-11 DIAGNOSIS — R011 Cardiac murmur, unspecified: Secondary | ICD-10-CM | POA: Diagnosis present

## 2016-10-15 ENCOUNTER — Telehealth: Payer: Self-pay | Admitting: Student

## 2016-10-15 NOTE — Telephone Encounter (Signed)
Pt would like her echo results from 10-11-16 please.

## 2016-10-15 NOTE — Telephone Encounter (Signed)
Returned call to patient, made aware of echo results:    Notes Recorded by Ellsworth LennoxBrittany M Strader, PA on 10/15/2016 at 8:04 AM EST Please let the patient know her echocardiogram showed normal pumping function with an EF of 60-65% with no wall motion abnormalities. Her valves are "mildly leaky" which can occur with age. We will continue to follow this to make sure it does not progress. Thank you.   Pt verbalized understanding.  No further questions/concerns at this time.    No f/u or recall noted-will route to PA to confirm when f/u is needed.  Pt aware.

## 2016-10-15 NOTE — Telephone Encounter (Signed)
Can follow-up with Dr. Antoine PocheHochrein in 1 year. Call if needing to be seen sooner.

## 2016-10-15 NOTE — Telephone Encounter (Signed)
Returned call to patient and made aware of recommended f/u with Dr. Antoine PocheHochrein per Randall AnBrittany Strader PA.  Recall placed-pt aware and verbalized understanding.  Advised to call with any questions or concerns.

## 2019-04-26 ENCOUNTER — Telehealth: Payer: Self-pay | Admitting: *Deleted

## 2019-04-26 NOTE — Telephone Encounter (Signed)
A message was left, re: follow up visit. 

## 2020-10-17 ENCOUNTER — Other Ambulatory Visit (HOSPITAL_COMMUNITY): Payer: Self-pay | Admitting: Endocrinology

## 2020-10-17 DIAGNOSIS — R000 Tachycardia, unspecified: Secondary | ICD-10-CM

## 2020-12-11 ENCOUNTER — Other Ambulatory Visit: Payer: Self-pay

## 2020-12-11 ENCOUNTER — Ambulatory Visit (HOSPITAL_COMMUNITY): Payer: 59 | Attending: Cardiovascular Disease

## 2020-12-11 DIAGNOSIS — R000 Tachycardia, unspecified: Secondary | ICD-10-CM | POA: Diagnosis present

## 2020-12-11 LAB — ECHOCARDIOGRAM COMPLETE
AR max vel: 2 cm2
AV Area VTI: 1.88 cm2
AV Area mean vel: 1.84 cm2
AV Mean grad: 9 mmHg
AV Peak grad: 19.4 mmHg
Ao pk vel: 2.2 m/s
Area-P 1/2: 3.72 cm2
P 1/2 time: 395 msec
S' Lateral: 2.8 cm

## 2021-07-02 LAB — COLOGUARD: COLOGUARD: NEGATIVE

## 2021-07-02 LAB — EXTERNAL GENERIC LAB PROCEDURE: COLOGUARD: NEGATIVE

## 2021-07-03 ENCOUNTER — Ambulatory Visit: Payer: Self-pay | Admitting: Allergy

## 2021-08-06 ENCOUNTER — Encounter: Payer: Self-pay | Admitting: Allergy and Immunology

## 2021-08-06 ENCOUNTER — Other Ambulatory Visit: Payer: Self-pay

## 2021-08-06 ENCOUNTER — Ambulatory Visit (INDEPENDENT_AMBULATORY_CARE_PROVIDER_SITE_OTHER): Payer: 59 | Admitting: Allergy and Immunology

## 2021-08-06 VITALS — BP 126/82 | HR 78 | Resp 16 | Ht 60.0 in | Wt 171.4 lb

## 2021-08-06 DIAGNOSIS — I350 Nonrheumatic aortic (valve) stenosis: Secondary | ICD-10-CM

## 2021-08-06 DIAGNOSIS — J3089 Other allergic rhinitis: Secondary | ICD-10-CM

## 2021-08-06 NOTE — Progress Notes (Signed)
[place] - [place] - [place] - [place] - [place]   Dear dr. Evlyn Kanner,  Thank you for referring Candace Walls to the [facility] [place] on 08/06/2021.   Below is a summation of this patient's evaluation and recommendations.  Thank you for your referral. I will keep you informed about this patient's response to treatment.   If you have any questions please do not hesitate to contact me.   Sincerely,  Jessica Priest, MD Allergy / Immunology [facility]   ______________________________________________________________________    NEW PATIENT NOTE  Referring Provider: Adrian Prince, MD Primary Provider: Gordan Payment., MD Date of office visit: 08/06/2021    Subjective:   Chief Complaint:  Candace Walls (DOB: 26-Feb-1961) is a 60 y.o. female who presents to the clinic on 08/06/2021 with a chief complaint of Mold Exposure .     HPI: Candace Walls presents to this clinic in evaluation of respiratory tract problems precipitated by mold.  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  She would develop some intermittent cough and some intermittent sneezing and some intermittent headaches and some issues with epistaxis that required cautery on the left side.  She had this type of exposure for about 5 years and everyone within the bank was having a similar problem and there was mold and moisture identified inside the bank, and her bank was closed about 2 weeks ago and she has been out of that environment and she is doing great.  She has no respiratory tract symptoms at all at this point in time.  Past Medical History:  Diagnosis Date   Diabetes mellitus without complication (HCC)    Hypertension    Thyroid disease     Past Surgical History:  Procedure Laterality Date   CHOLECYSTECTOMY     DNC      Allergies as of  08/06/2021       Reactions   Sulfa Antibiotics Hives   Minocycline Hives, Swelling, Rash        Medication List    carvedilol 6.25 MG tablet Commonly known as: COREG Take 6.25 mg by mouth 2 (two) times daily with a meal.   fluconazole 100 MG tablet Commonly known as: DIFLUCAN Take 100 mg by mouth daily.   hydrochlorothiazide 25 MG tablet Commonly known as: HYDRODIURIL Take 25 mg by mouth daily.   ibuprofen 200 MG tablet Commonly known as: ADVIL Take 200 mg by mouth every 6 (six) hours as needed for mild pain.   levothyroxine 100 MCG tablet Commonly known as: SYNTHROID Take 100 mcg by mouth daily before breakfast.   losartan 50 MG tablet Commonly known as: COZAAR Take 50 mg by mouth daily.   omeprazole 40 MG capsule Commonly known as: PRILOSEC Take 1 capsule (40 mg total) by mouth daily.   potassium chloride SA 20 MEQ tablet Commonly known as: KLOR-CON Take 20 mEq by mouth daily.   Xigduo XR 07-999 MG Tb24 Generic drug: Dapagliflozin-metFORMIN HCl ER Take 1 tablet by mouth daily.    Review of systems negative except as noted in HPI / PMHx or noted below:  Review of Systems  Constitutional: Negative.   HENT: Negative.    Eyes: Negative.   Respiratory: Negative.    Cardiovascular: Negative.   Gastrointestinal: Negative.   Genitourinary: Negative.   Musculoskeletal: Negative.   Skin: Negative.  Neurological: Negative.   Endo/Heme/Allergies: Negative.   Psychiatric/Behavioral: Negative.     Family History  Problem Relation Age of Onset   Heart disease Father        Fatal MI in his 48's.    Diabetes Father    Renal Disease Father        ESRD   Heart disease Sister    Stroke Maternal Grandfather     Social History   Socioeconomic History   Marital status: Married    Spouse name: Not on file   Number of children: Not on file   Years of education: Not on file   Highest education level: Not on file  Occupational History   Not on file   Tobacco Use   Smoking status: Never   Smokeless tobacco: Never  Substance and Sexual Activity   Alcohol use: No   Drug use: No   Sexual activity: Not on file  Other Topics Concern   Not on file  Social History Narrative   Not on file   Environmental and Social history  Lives in a mobile home with a dry environment, a dog located inside the household, carpet in the bedroom, plastic on the bed, plastic on the pillow, no smoking ongoing with inside the household.  She works as a Haematologist.  Objective:   Vitals:   08/06/21 1437  BP: 126/82  Pulse: 78  Resp: 16  SpO2: 97%   Height: 5' (152.4 cm) Weight: 171 lb 6.4 oz (77.7 kg)  Physical Exam Constitutional:      Appearance: She is not diaphoretic.  HENT:     Head: Normocephalic.     Right Ear: Tympanic membrane, ear canal and external ear normal.     Left Ear: Tympanic membrane, ear canal and external ear normal.     Nose: Nose normal. No mucosal edema or rhinorrhea.     Mouth/Throat:     Pharynx: Uvula midline. No oropharyngeal exudate.  Eyes:     Conjunctiva/sclera: Conjunctivae normal.  Neck:     Thyroid: No thyromegaly.     Trachea: Trachea normal. No tracheal tenderness or tracheal deviation.  Cardiovascular:     Rate and Rhythm: Normal rate and regular rhythm.     Heart sounds: S1 normal and S2 normal. Murmur (Systolic) heard.  Pulmonary:     Effort: No respiratory distress.     Breath sounds: Normal breath sounds. No stridor. No wheezing or rales.  Lymphadenopathy:     Head:     Right side of head: No tonsillar adenopathy.     Left side of head: No tonsillar adenopathy.     Cervical: No cervical adenopathy.  Skin:    Findings: No erythema or rash.     Nails: There is no clubbing.  Neurological:     Mental Status: She is alert.    Diagnostics:  Results of an echocardiogram obtained 11 December 2020 identified the following:   1. The aortic valve is tricuspid. There is mild calcification of the   aortic valve. There is mild thickening of the aortic valve. Aortic valve  regurgitation is mild. Mild aortic valve stenosis. Aortic valve area, by  VTI measures 1.88 cm. Aortic valve  mean gradient measures 9.0 mmHg. Aortic valve Vmax measures 2.20 m/s.   2. Left ventricular ejection fraction, by estimation, is 55 to 60%. Left  ventricular ejection fraction by 3D volume is 59 %. The left ventricle has  normal function. The left ventricle has no regional  wall motion  abnormalities. Left ventricular diastolic   parameters are indeterminate. The average left ventricular global  longitudinal strain is -22.1 %. The global longitudinal strain is normal.   3. Right ventricular systolic function is normal. The right ventricular  size is normal. There is normal pulmonary artery systolic pressure. The  estimated right ventricular systolic pressure is 26.0 mmHg.   4. The mitral valve is grossly normal. Trivial mitral valve  regurgitation. No evidence of mitral stenosis.   5. The inferior vena cava is normal in size with greater than 50%  respiratory variability, suggesting right atrial pressure of 3 mmHg.    Assessment and Plan:    1. Perennial allergic rhinitis     1.  Remain away from mold exposure  2.  Can use OTC Nasacort -1-2 sprays each nostril 3-7 times a week.  Takes days to work  3.  Can use OTC antihistamine -Claritin/Zyrtec/Allegra -1 time per day if needed  4.  Contact clinic if problems develop in the future  5.  Obtain fall flu vaccine  6.  Mild aortic stenosis  At this point I do not think that we need to do very much regarding Candace Walls's exposure to mold as most of her symptoms have completely abated since that exposure has been eliminated.  She can utilize a nasal steroid and antihistamine should it be required in the future.  I have asked her to contact me should she develop problems as she moves forward.  I also let her know the results of her echocardiogram which  identified some mild aortic stenosis most likely the reason for her murmur and she understands the need to have follow-up with repetitive echoes in the future regarding that issue.  Jessica Priest, MD Allergy / Immunology [facility] [place]

## 2021-08-06 NOTE — Patient Instructions (Addendum)
  1.  Remain away from mold exposure  2.  Can use OTC Nasacort -1-2 sprays each nostril 3-7 times a week.  Takes days to work  3.  Can use OTC antihistamine -Claritin/Zyrtec/Allegra -1 time per day if needed  4.  Contact clinic if problems develop in the future  5.  Obtain fall flu vaccine  6.  Mild aortic stenosis

## 2021-08-07 ENCOUNTER — Encounter: Payer: Self-pay | Admitting: Allergy and Immunology

## 2021-08-23 ENCOUNTER — Ambulatory Visit: Payer: Self-pay | Admitting: Allergy and Immunology

## 2022-11-04 ENCOUNTER — Other Ambulatory Visit (HOSPITAL_COMMUNITY): Payer: Self-pay

## 2022-11-04 MED ORDER — OZEMPIC (1 MG/DOSE) 4 MG/3ML ~~LOC~~ SOPN
1.0000 mg | PEN_INJECTOR | SUBCUTANEOUS | 6 refills | Status: DC
  Filled 2022-11-04: qty 3, 28d supply, fill #0

## 2022-11-07 ENCOUNTER — Other Ambulatory Visit (HOSPITAL_COMMUNITY): Payer: Self-pay

## 2022-11-14 ENCOUNTER — Other Ambulatory Visit (HOSPITAL_COMMUNITY): Payer: Self-pay

## 2022-11-28 ENCOUNTER — Other Ambulatory Visit (HOSPITAL_COMMUNITY): Payer: Self-pay

## 2022-11-28 MED ORDER — OZEMPIC (0.25 OR 0.5 MG/DOSE) 2 MG/3ML ~~LOC~~ SOPN
0.5000 mg | PEN_INJECTOR | SUBCUTANEOUS | 5 refills | Status: AC
  Filled 2022-11-28: qty 3, 28d supply, fill #0
